# Patient Record
Sex: Female | Born: 2000
Health system: Southern US, Community
[De-identification: ages and names within clinical notes are randomized; demographics above are authoritative.]

## PROBLEM LIST (undated history)

## (undated) DIAGNOSIS — M47817 Spondylosis without myelopathy or radiculopathy, lumbosacral region: Secondary | ICD-10-CM

## (undated) HISTORY — PX: MOUTH SURGERY: SHX715

---
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
## 2019-03-01 ENCOUNTER — Telehealth: Payer: Self-pay | Admitting: Family Medicine

## 2019-03-01 NOTE — Telephone Encounter (Signed)
Pt states that she has an appt with ortho surgeon tomorrow and will get the results of her MRI at that time.  She will send MyChart message after her appointment with surgeon to inform Dr. Georgina Snell re: disposition, treatment and follow up recommended by ortho surgeon.  Charyl Bigger, CMA

## 2019-03-01 NOTE — Telephone Encounter (Signed)
Received after-hours call.  Patient has foot or ankle injury and had some evaluation already.  Recommend recheck in my office in near future.

## 2019-04-12 IMAGING — DX DG HIP (WITH OR WITHOUT PELVIS) 2-3V*L*
3 series · 3 of 3 positions shown · non-contrast
Comparison: None.

CLINICAL DATA: Acute onset bilateral hip pain. Recent diagnosis of
facet arthropathy. Began physical therapy 1 week ago.

EXAM:
DG HIP (WITH OR WITHOUT PELVIS) 2-3V LEFT

[hip ap]
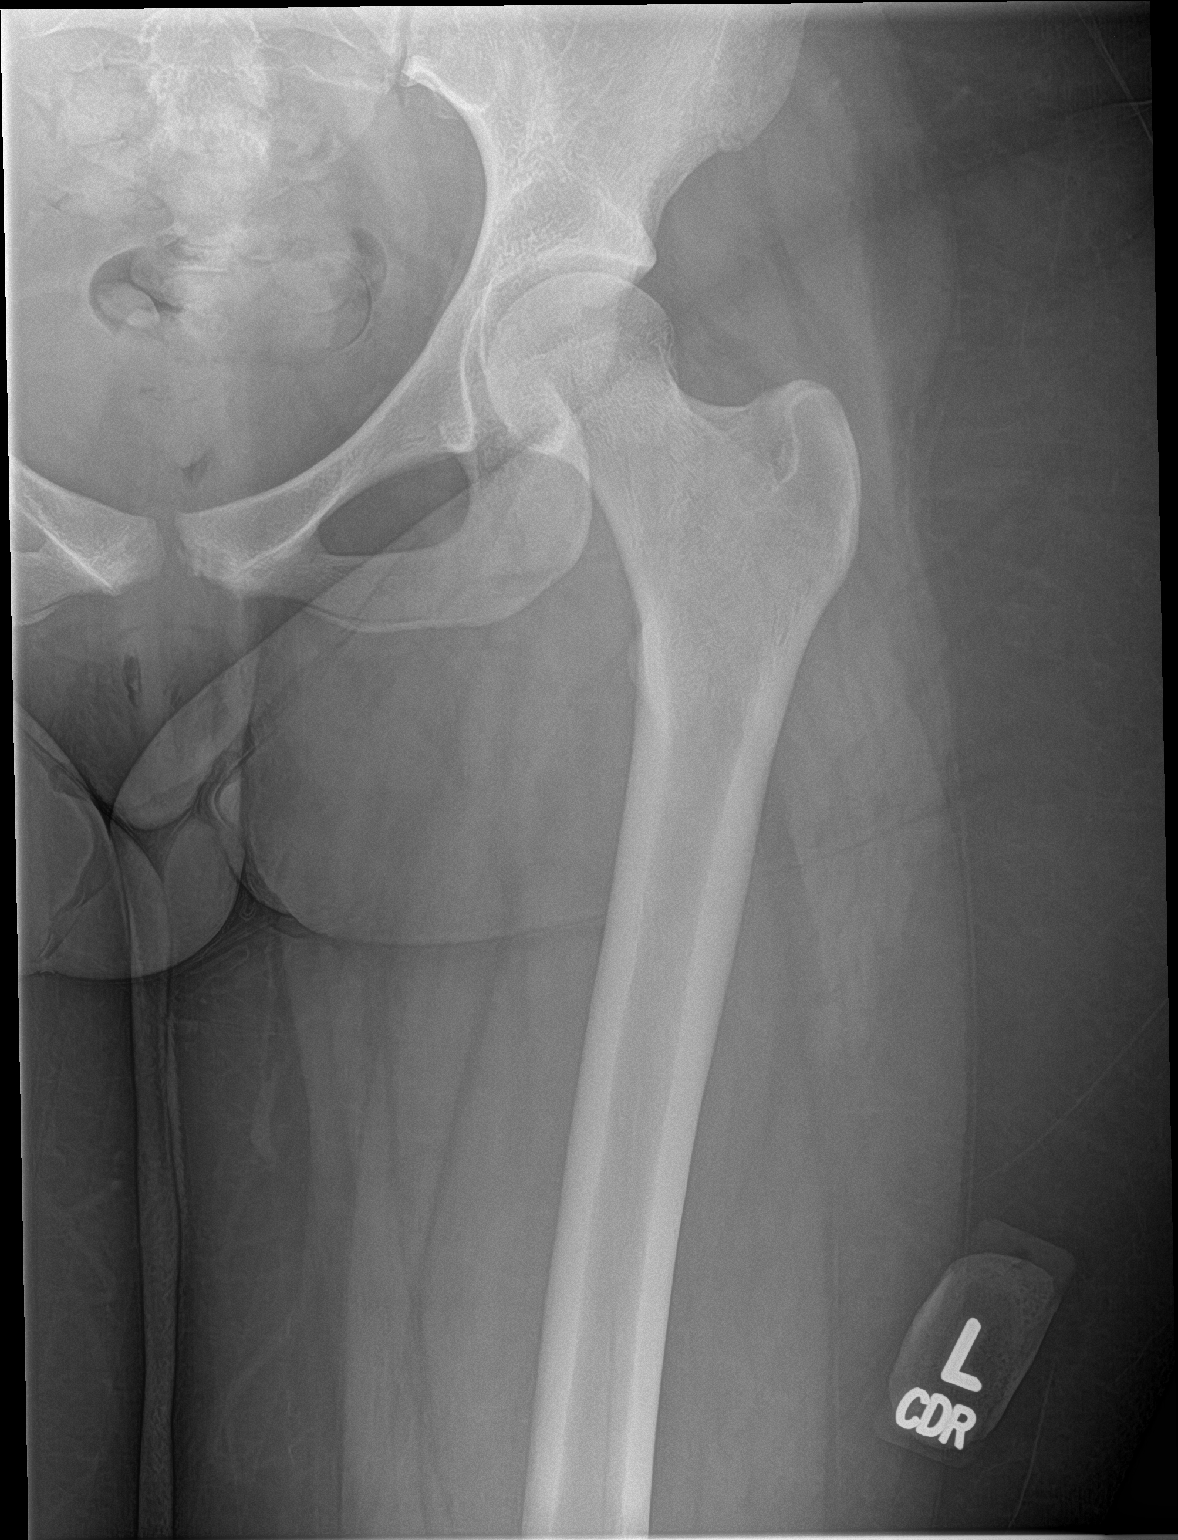

[hip frog leg (1 of 2)]
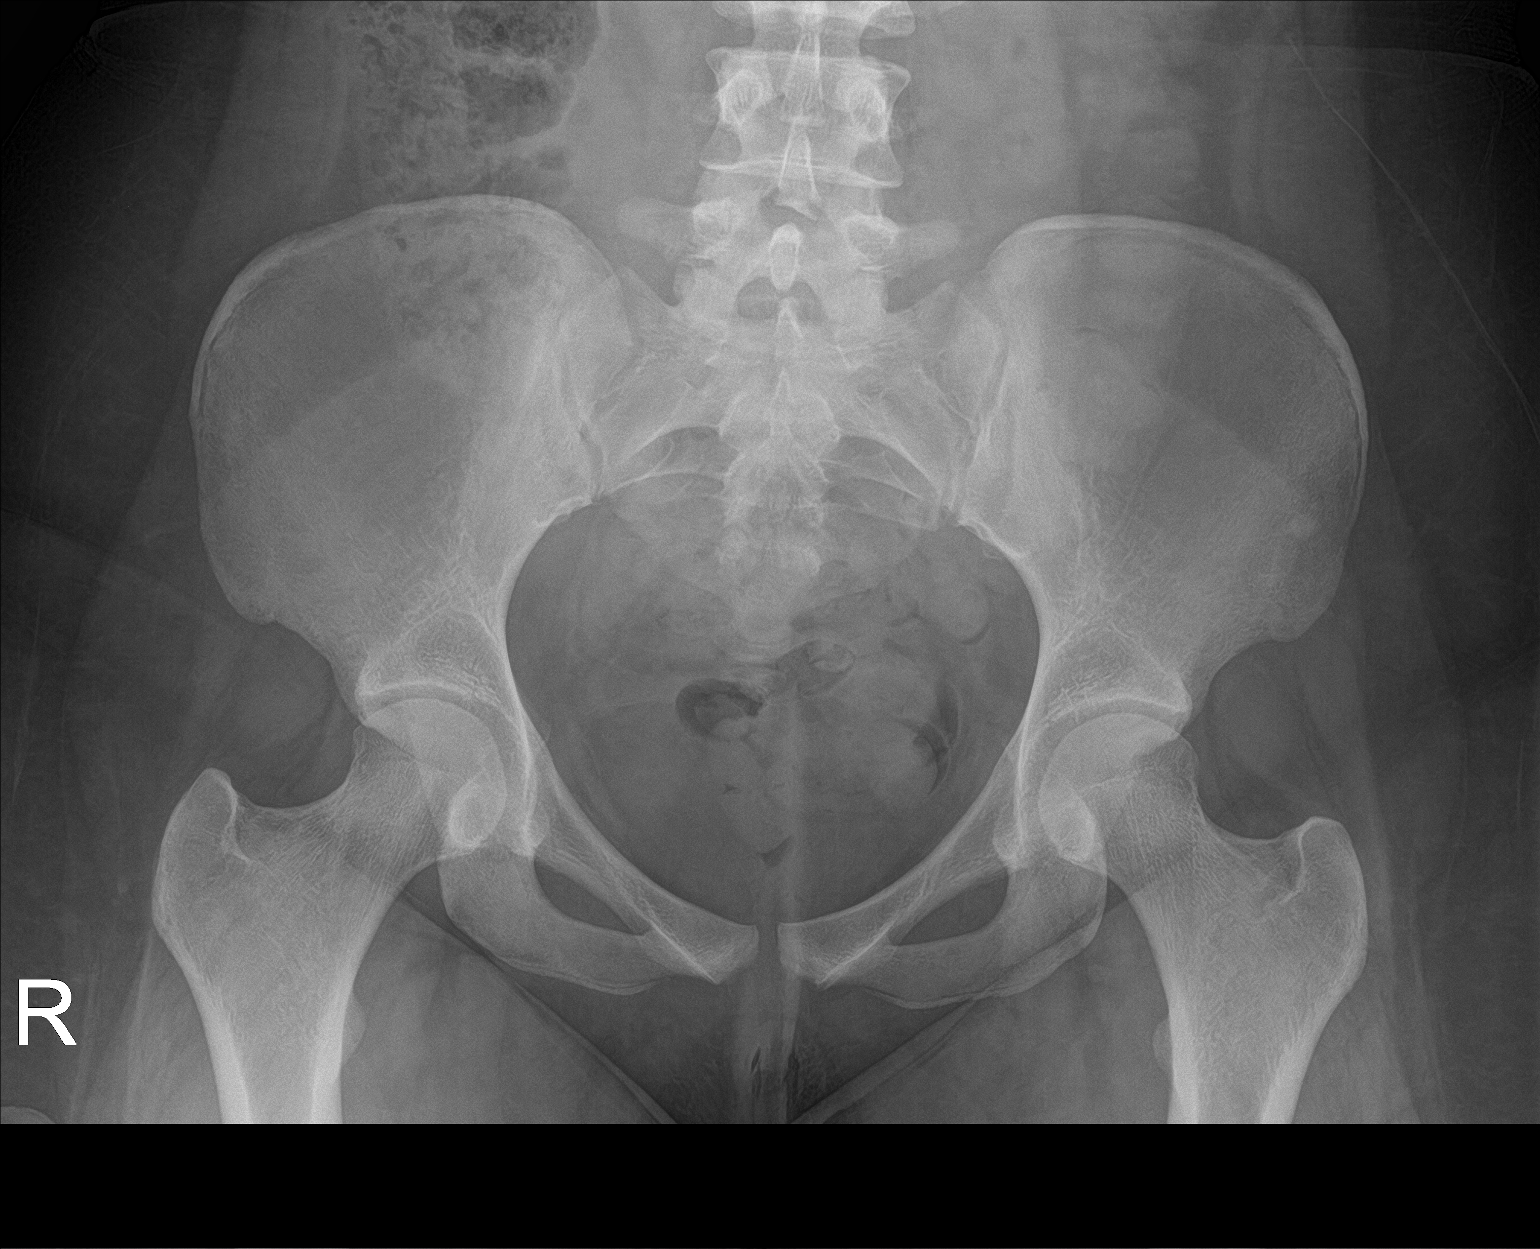

[hip frog leg (2 of 2)]
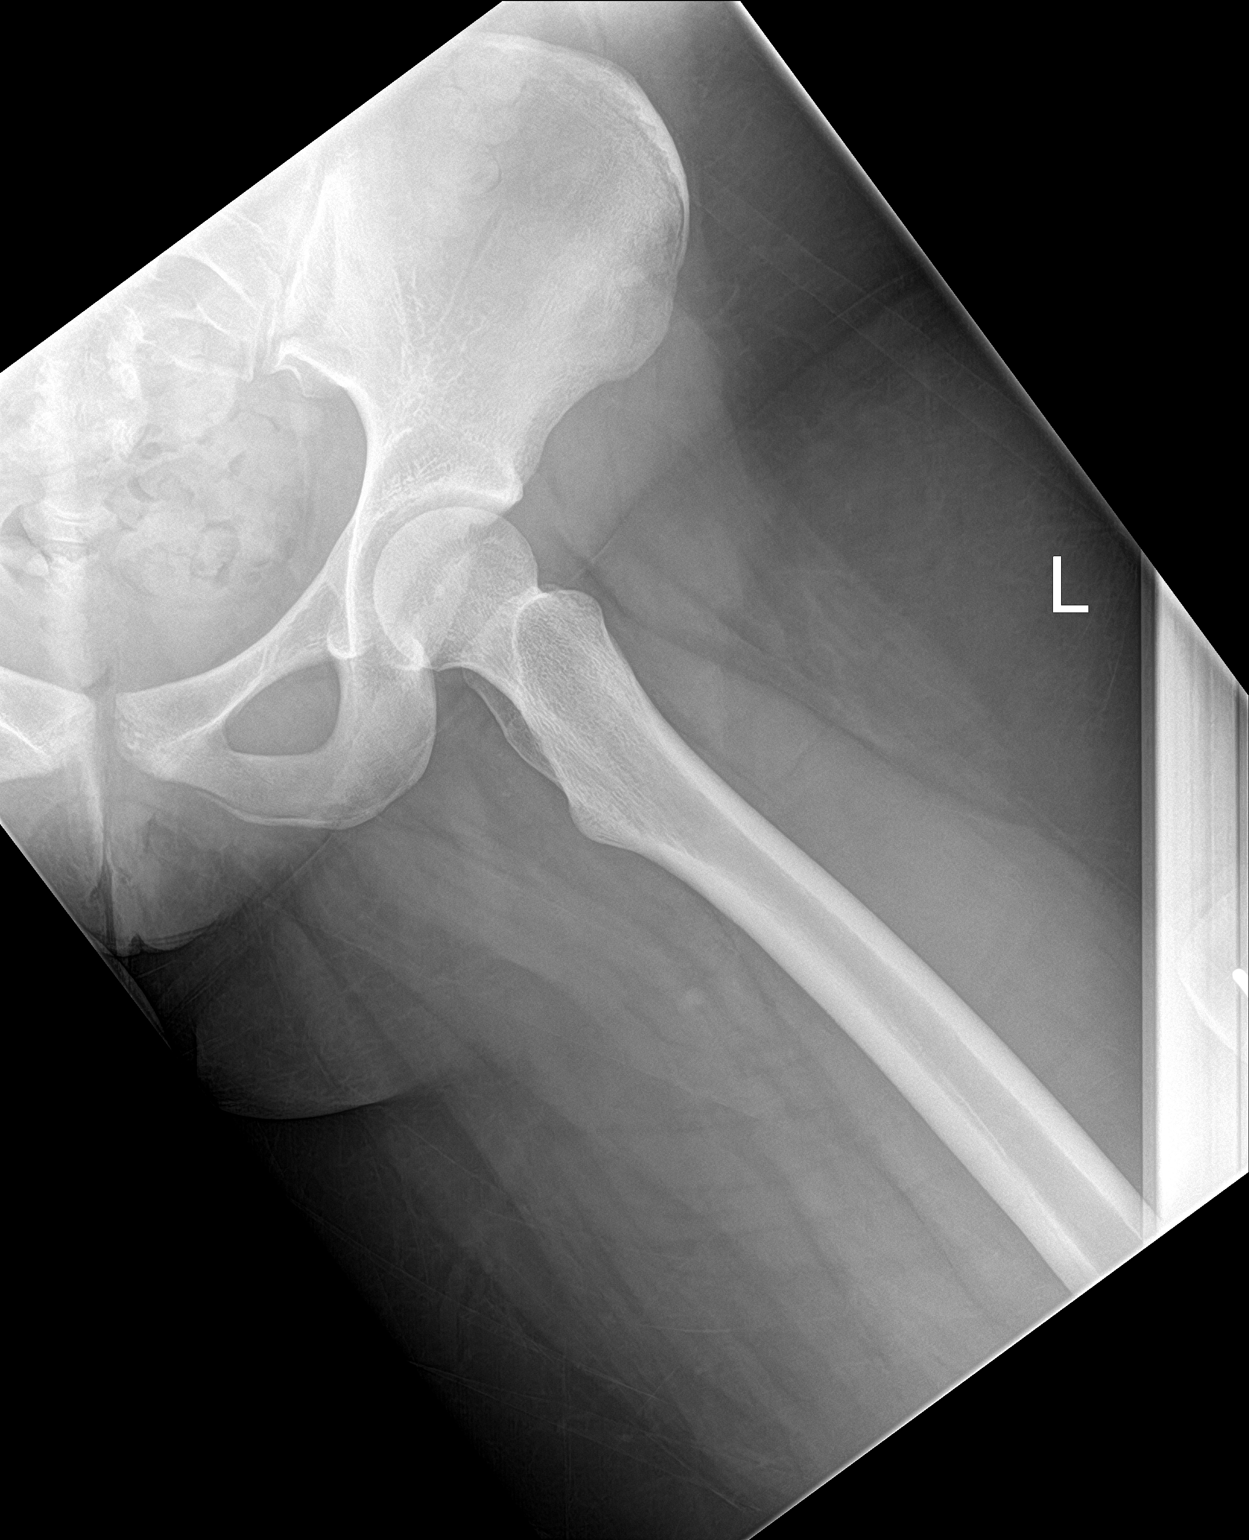

[3 of 3 positions shown; findings below may reference images not displayed]

FINDINGS: There is no evidence of hip fracture or dislocation. Skeletally
immature. There is no evidence of arthropathy or other focal bone
abnormality.
IMPRESSION: Negative.

## 2021-12-30 ENCOUNTER — Encounter: Payer: Self-pay | Admitting: *Deleted
